# Patient Record
Sex: Male | Born: 2000
Health system: Southern US, Community
[De-identification: ages and names within clinical notes are randomized; demographics above are authoritative.]

## PROBLEM LIST (undated history)

## (undated) DIAGNOSIS — K219 Gastro-esophageal reflux disease without esophagitis: Secondary | ICD-10-CM

## (undated) HISTORY — DX: Gastro-esophageal reflux disease without esophagitis: K21.9

---
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
## 2020-10-14 ENCOUNTER — Telehealth (INDEPENDENT_AMBULATORY_CARE_PROVIDER_SITE_OTHER): Payer: BC Managed Care – PPO | Admitting: Family Medicine

## 2020-10-14 ENCOUNTER — Encounter: Payer: Self-pay | Admitting: Family Medicine

## 2020-10-14 ENCOUNTER — Other Ambulatory Visit: Payer: Self-pay

## 2020-10-14 DIAGNOSIS — J4520 Mild intermittent asthma, uncomplicated: Secondary | ICD-10-CM | POA: Diagnosis not present

## 2020-10-14 NOTE — Progress Notes (Signed)
Jacob Herberg T. Bocephus Cali, MD Primary Care and Sports Medicine [facility] at [facility] [address] [place], [address] Phone: [phone]  FAX: [phone]  Jacob Hull - 19 y.o. male  MRN [id number]  Date of Birth: 2001/04/28  Visit Date: 10/14/2020  PCP: Karie Schwalbe, MD  Referred by: Karie Schwalbe, MD Chief Complaint  Patient presents with  . Cough    Patient states he has had a cough x 6 years   Virtual Visit via Video Note:  I connected with  Jacob Hull on 10/14/2020  9:40 AM EST by a video enabled telemedicine application and verified that I am speaking with the correct person using two identifiers.   Location patient: home computer, tablet, or smartphone Location provider: work or home office Consent: Verbal consent directly obtained from [facility]. Persons participating in the virtual visit: patient, provider  I discussed the limitations of evaluation and management by telemedicine and the availability of in person appointments. The patient expressed understanding and agreed to proceed.  Interactive audio and video telecommunications were attempted between this provider and patient, however failed, due to patient having technical difficulties OR patient did not have access to video capability.  We continued and completed visit with audio only.     History of Present Illness:  Coughing off and on right now.  6 years.    He has had some coughing for about 6 years, and he does carry a diagnosis of asthma.  On our conversation, he thinks that he is not any different and all compared to his normal baseline.  He denies any fever, chills, sweats, headache, nasal congestion, earache, sore throat, nauseousness, vomiting, diarrhea, productive cough.  Globally he feels completely normal.  He does tell me that basically the only reason that he called and set up an appointment was that his parents wanted him to do so.  Review of  Systems as above: See pertinent positives and pertinent negatives per HPI No acute distress verbally   Observations/Objective/Exam:  An attempt was made to discern vital signs over the phone and per patient if applicable and possible.   General:    Alert, Oriented, appears well and in no acute distress  Pulmonary:     On inspection no signs of respiratory distress.  Psych / Neurological:     Pleasant and cooperative.  Assessment and Plan:    ICD-10-CM   1. Mild intermittent asthma in adult without complication  J45.20    Total encounter time: 10 minutes. This includes total time spent on the day of encounter.  He you are reiterated a number of times that he thinks that he is at baseline and he has no other symptoms.  He does use an albuterol inhaler as needed, but he has not done this in some time as well.  At this point I reassured him, I do not think that he needs to change anything.  He does note intermittently uses albuterol if he does have some wheezing or coughing gets a little bit worse.  I discussed the assessment and treatment plan with the patient. The patient was provided an opportunity to ask questions and all were answered. The patient agreed with the plan and demonstrated an understanding of the instructions.   The patient was advised to call back or seek an in-person evaluation if the symptoms worsen or if the condition fails to improve as anticipated.  Follow-up: prn unless noted otherwise below No follow-ups on file.  No orders of the defined types were placed in this encounter.  No orders of the defined types were placed in this encounter.   Signed,  Elpidio Galea. Gracey Tolle, MD

## 2021-04-27 ENCOUNTER — Telehealth: Payer: Self-pay | Admitting: *Deleted

## 2021-04-27 NOTE — Telephone Encounter (Signed)
Left message asking pt's mom to have pt call and either authorize we speak to her or we can speak to him directly since there is not DPR on file.

## 2021-04-27 NOTE — Telephone Encounter (Signed)
There is not much I can do from here--especially if he was seen in person. Pain medications like aleve/advil or tylenol can help. I assume they saw no secondary infections----but I usually use silvadene cream on the blisters once they break. (Okay to send tube 50gm x 0) if they want If he worsens, he should be seen again in person
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
## 2021-10-05 DIAGNOSIS — J069 Acute upper respiratory infection, unspecified: Secondary | ICD-10-CM | POA: Diagnosis not present

## 2021-10-05 DIAGNOSIS — Z20822 Contact with and (suspected) exposure to covid-19: Secondary | ICD-10-CM | POA: Diagnosis not present

## 2022-02-14 DIAGNOSIS — R059 Cough, unspecified: Secondary | ICD-10-CM | POA: Diagnosis not present

## 2022-02-14 DIAGNOSIS — J069 Acute upper respiratory infection, unspecified: Secondary | ICD-10-CM | POA: Diagnosis not present

## 2022-02-14 DIAGNOSIS — R110 Nausea: Secondary | ICD-10-CM | POA: Diagnosis not present

## 2022-02-14 DIAGNOSIS — Z20822 Contact with and (suspected) exposure to covid-19: Secondary | ICD-10-CM | POA: Diagnosis not present

## 2023-05-17 DIAGNOSIS — J029 Acute pharyngitis, unspecified: Secondary | ICD-10-CM | POA: Diagnosis not present

## 2023-05-17 DIAGNOSIS — R112 Nausea with vomiting, unspecified: Secondary | ICD-10-CM | POA: Diagnosis not present

## 2023-05-17 DIAGNOSIS — Z20822 Contact with and (suspected) exposure to covid-19: Secondary | ICD-10-CM | POA: Diagnosis not present

## 2023-05-17 DIAGNOSIS — R059 Cough, unspecified: Secondary | ICD-10-CM | POA: Diagnosis not present

## 2023-05-17 DIAGNOSIS — U071 COVID-19: Secondary | ICD-10-CM | POA: Diagnosis not present

## 2023-09-05 ENCOUNTER — Ambulatory Visit (INDEPENDENT_AMBULATORY_CARE_PROVIDER_SITE_OTHER): Payer: BC Managed Care – PPO | Admitting: Internal Medicine

## 2023-09-05 ENCOUNTER — Encounter: Payer: Self-pay | Admitting: Internal Medicine

## 2023-09-05 VITALS — BP 110/66 | HR 79 | Temp 98.0°F | Ht 71.0 in | Wt 267.0 lb

## 2023-09-05 DIAGNOSIS — F900 Attention-deficit hyperactivity disorder, predominantly inattentive type: Secondary | ICD-10-CM | POA: Diagnosis not present

## 2023-09-05 DIAGNOSIS — Z0000 Encounter for general adult medical examination without abnormal findings: Secondary | ICD-10-CM | POA: Diagnosis not present

## 2023-09-05 DIAGNOSIS — Z23 Encounter for immunization: Secondary | ICD-10-CM

## 2023-09-05 MED ORDER — AMPHETAMINE-DEXTROAMPHETAMINE 10 MG PO TABS: 10.0000 mg | ORAL_TABLET | Freq: Two times a day (BID) | ORAL | 0 refills | Status: DC

## 2023-09-05 NOTE — Assessment & Plan Note (Signed)
Will try adderall 10mg  bid and titrate as needed Can change to long acting prn as well

## 2023-09-05 NOTE — Progress Notes (Signed)
Subjective:    Patient ID: Jacob Hull, male    DOB: 2001/08/15, 22 y.o.   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 18 g 0   No current facility-administered medications on file prior to visit.    No Known Allergies  Past Medical History:  Diagnosis Date   GERD (gastroesophageal reflux disease)    since early teens    Past Surgical History:  Procedure Laterality Date   ADENOIDECTOMY  2004   with bilateral tympanostomies   ADENOIDECTOMY AND MYRINGOTOMY WITH TUBE PLACEMENT  2003    Family History  Problem Relation Age of Onset   Colon cancer Mother    Depression Mother    Hypertension Mother    Hypertension Father    Arthritis Maternal Grandmother    Breast cancer Maternal Grandmother    Uterine cancer Maternal Grandmother    Hypertension Maternal Grandmother    Heart disease Paternal Grandmother    Lung cancer Paternal Grandfather     Social History   Socioeconomic History   Marital status: Single    Spouse name: Not on file   Number of children: Not on file   Years of education: Not on file   Highest education level: Not on file  Occupational History   Occupation: Banker: [employer]  Tobacco Use   Smoking status: Never    Passive exposure: Past   Smokeless tobacco: Never  Substance and Sexual Activity   Alcohol use: Never    Drug use: Never   Sexual activity: Never  Other Topics Concern   Not on file  Social History Narrative   Restaurant manager, fast food   Social Determinants of Health   Financial Resource Strain: Not on file  Food Insecurity: Not on file  Transportation Needs: Not on file  Physical Activity: Not on file  Stress: Not on file  Social Connections: Not on file  Intimate Partner Violence: Not on file   Review of Systems  Constitutional:  Negative for fatigue and unexpected weight change.       Walks for exercise---works nights. Gym in the past Wears seat belt  HENT:  Negative for hearing loss and trouble swallowing.        Rare tinnitus Keeps up with dentist  Eyes:  Negative for visual disturbance.       No diplopia or unilateral vision loss  Respiratory:  Positive for cough. Negative for chest tightness and shortness of breath.        Chronic cough---told GERD   Cardiovascular:  Negative for chest pain, palpitations and leg swelling.  Gastrointestinal:  Negative for blood in stool and constipation.       Heartburn better with improved eating  Genitourinary:  Negative for difficulty urinating, dysuria and hematuria.       No sexual problems--uses condoms  Musculoskeletal:  Negative for arthralgias, back pain and joint swelling.  Skin:  Negative  for rash.  Allergic/Immunologic: Negative for environmental allergies and immunocompromised state.  Neurological:  Negative for dizziness, syncope, light-headedness and headaches.  Hematological:  Negative for adenopathy. Does not bruise/bleed easily.  Psychiatric/Behavioral:  Negative for dysphoric mood and sleep disturbance. The patient is not nervous/anxious.        Objective:   Physical Exam Constitutional:      Appearance: Normal appearance.  HENT:     Mouth/Throat:     Pharynx: No oropharyngeal exudate or posterior oropharyngeal erythema.  Eyes:     Conjunctiva/sclera: Conjunctivae normal.     Pupils: Pupils are equal, round, and  reactive to light.  Cardiovascular:     Rate and Rhythm: Normal rate and regular rhythm.     Pulses: Normal pulses.     Heart sounds: No murmur heard.    No gallop.  Pulmonary:     Effort: Pulmonary effort is normal.     Breath sounds: Normal breath sounds. No wheezing or rales.  Abdominal:     Palpations: Abdomen is soft.     Tenderness: There is no abdominal tenderness.  Genitourinary:    Testes: Normal.  Musculoskeletal:     Cervical back: Neck supple.     Right lower leg: No edema.     Left lower leg: No edema.  Lymphadenopathy:     Cervical: No cervical adenopathy.  Skin:    Findings: No lesion or rash.  Neurological:     General: No focal deficit present.     Mental Status: He is alert and oriented to person, place, and time.  Psychiatric:        Mood and Affect: Mood normal.        Behavior: Behavior normal.            Assessment & Plan:

## 2023-09-05 NOTE — Assessment & Plan Note (Signed)
Healthy Has been exercising Tries to be careful with eating Td today Prefers no flu or COVID vaccine Will need colonoscopy due to mom's history (he thinks she was over 50 at diagnosis)

## 2023-11-06 ENCOUNTER — Encounter: Payer: Self-pay | Admitting: Internal Medicine

## 2023-11-06 ENCOUNTER — Ambulatory Visit: Payer: BC Managed Care – PPO | Admitting: Internal Medicine

## 2023-11-06 VITALS — BP 110/70 | HR 80 | Temp 98.3°F | Ht 71.0 in | Wt 276.0 lb

## 2023-11-06 DIAGNOSIS — F900 Attention-deficit hyperactivity disorder, predominantly inattentive type: Secondary | ICD-10-CM | POA: Diagnosis not present

## 2023-11-06 MED ORDER — AMPHETAMINE-DEXTROAMPHET ER 30 MG PO CP24: 30.0000 mg | ORAL_CAPSULE | Freq: Every day | ORAL | 0 refills | Status: DC

## 2023-11-06 NOTE — Progress Notes (Signed)
   Subjective:    Patient ID: Jacob Hull, male    DOB: 2001/07/17, 23 y.o.   MRN: [id number]  HPI Here for follow up of ADHD  The medication helps Only takes it on work days Forgets the second dose often--and then notices the difference (trouble writing his notes especially) Even supervisor notes difference in quality of notes and work when he is on it  Current Outpatient Medications on File Prior to Visit  Medication Sig Dispense Refill   albuterol (VENTOLIN HFA) 108 (90 Base) MCG/ACT inhaler INHALE 2 PUFFS INTO THE LUNGS EVERY 4 (FOUR) HOURS AS NEEDED FOR WHEEZING OR SHORTNESS OF BREATH. 18 g 0   amphetamine-dextroamphetamine (ADDERALL) 10 MG tablet Take 1 tablet (10 mg total) by mouth 2 (two) times daily. 60 tablet 0   No current facility-administered medications on file prior to visit.    No Known Allergies  Past Medical History:  Diagnosis Date   GERD (gastroesophageal reflux disease)    since early teens    Past Surgical History:  Procedure Laterality Date   ADENOIDECTOMY  2004   with bilateral tympanostomies   ADENOIDECTOMY AND MYRINGOTOMY WITH TUBE PLACEMENT  2003    Family History  Problem Relation Age of Onset   Colon cancer Mother    Depression Mother    Hypertension Mother    Hypertension Father    Arthritis Maternal Grandmother    Breast cancer Maternal Grandmother    Uterine cancer Maternal Grandmother    Hypertension Maternal Grandmother    Heart disease Paternal Grandmother    Lung cancer Paternal Grandfather     Social History   Socioeconomic History   Marital status: Single    Spouse name: Not on file   Number of children: Not on file   Years of education: Not on file   Highest education level: Not on file  Occupational History   Occupation: Banker: [employer]  Tobacco Use   Smoking status: Never    Passive exposure: Past   Smokeless tobacco: Never  Substance and Sexual Activity   Alcohol use: Never    Drug use: Never   Sexual activity: Never  Other Topics Concern   Not on file  Social History Narrative   Restaurant manager, fast food   Social Drivers of Health   Financial Resource Strain: Not on file  Food Insecurity: Not on file  Transportation Needs: Not on file  Physical Activity: Not on file  Stress: Not on file  Social Connections: Not on file  Intimate Partner Violence: Not on file   Review of Systems No abdominal symptoms Appetite is fine--gained weight Sleeps okay--hard on off schedule (work hours 4:30PM---4:30AM)    Objective:   Physical Exam Constitutional:      Appearance: Normal appearance.  Neurological:     Mental Status: He is alert.  Psychiatric:        Mood and Affect: Mood normal.        Behavior: Behavior normal.            Assessment & Plan:

## 2023-11-06 NOTE — Assessment & Plan Note (Signed)
Has done well with the adderall--but can't remember the second dose and even when taking it, it wears out too soon Will change to 30mg  extended release Uses just for work

## 2023-12-18 ENCOUNTER — Other Ambulatory Visit: Payer: Self-pay | Admitting: Internal Medicine

## 2023-12-19 MED ORDER — AMPHETAMINE-DEXTROAMPHET ER 30 MG PO CP24: 30.0000 mg | ORAL_CAPSULE | Freq: Every day | ORAL | 0 refills | Status: DC

## 2023-12-19 NOTE — Telephone Encounter (Signed)
 Last filled 11-06-23 #30 Last OV 11-06-23 Next OV 05-07-24 [facility]

## 2024-02-09 ENCOUNTER — Other Ambulatory Visit: Payer: Self-pay | Admitting: Internal Medicine

## 2024-02-09 NOTE — Telephone Encounter (Signed)
 LAst written 12-19-23 #30 Last OV 11-06-23 Next OV 05-07-24 [facility]

## 2024-02-10 MED ORDER — AMPHETAMINE-DEXTROAMPHET ER 30 MG PO CP24: 30.0000 mg | ORAL_CAPSULE | Freq: Every day | ORAL | 0 refills | Status: DC

## 2024-04-22 ENCOUNTER — Encounter: Payer: Self-pay | Admitting: Internal Medicine

## 2024-04-24 MED ORDER — AMPHETAMINE-DEXTROAMPHET ER 30 MG PO CP24: 30.0000 mg | ORAL_CAPSULE | Freq: Every day | ORAL | 0 refills | Status: DC

## 2024-04-24 NOTE — Telephone Encounter (Signed)
 LOV - 11/06/23 NOV - 05/07/24 RF - 02/10/24 #30/0

## 2024-05-07 ENCOUNTER — Ambulatory Visit: Payer: BC Managed Care – PPO | Admitting: Internal Medicine

## 2024-05-07 ENCOUNTER — Encounter: Payer: Self-pay | Admitting: Internal Medicine

## 2024-05-07 VITALS — BP 104/70 | HR 76 | Temp 98.7°F | Ht 71.0 in | Wt 265.0 lb

## 2024-05-07 DIAGNOSIS — F900 Attention-deficit hyperactivity disorder, predominantly inattentive type: Secondary | ICD-10-CM | POA: Diagnosis not present

## 2024-05-07 NOTE — Assessment & Plan Note (Signed)
 Doing well with the extended release adderall XR 30mg  daily for work Much better since he doesn't need second dose No sig side effects

## 2024-05-07 NOTE — Progress Notes (Signed)
   Subjective:    Patient ID: Jacob Hull, male    DOB: Nov 17, 2000, 23 y.o.   MRN: [id number]  HPI Here for follow up of ADHD  Doing well on the extended release med No trouble sleeping No GI side effects and appetite is okay  Still uses it just for work Maintains its effect through his work day  Current Outpatient Medications on File Prior to Visit  Medication Sig Dispense Refill   albuterol  (VENTOLIN  HFA) 108 (90 Base) MCG/ACT inhaler INHALE 2 PUFFS INTO THE LUNGS EVERY 4 (FOUR) HOURS AS NEEDED FOR WHEEZING OR SHORTNESS OF BREATH. 18 g 0   amphetamine -dextroamphetamine  (ADDERALL XR) 30 MG 24 hr capsule Take 1 capsule (30 mg total) by mouth daily. 30 capsule 0   No current facility-administered medications on file prior to visit.    No Known Allergies  Past Medical History:  Diagnosis Date   GERD (gastroesophageal reflux disease)    since early teens    Past Surgical History:  Procedure Laterality Date   ADENOIDECTOMY  2004   with bilateral tympanostomies   ADENOIDECTOMY AND MYRINGOTOMY WITH TUBE PLACEMENT  2003    Family History  Problem Relation Age of Onset   Colon cancer Mother    Depression Mother    Hypertension Mother    Hypertension Father    Arthritis Maternal Grandmother    Breast cancer Maternal Grandmother    Uterine cancer Maternal Grandmother    Hypertension Maternal Grandmother    Heart disease Paternal Grandmother    Lung cancer Paternal Grandfather     Social History   Socioeconomic History   Marital status: Single    Spouse name: Not on file   Number of children: Not on file   Years of education: Not on file   Highest education level: Not on file  Occupational History   Occupation: Banker: [employer]  Tobacco Use   Smoking status: Never    Passive exposure: Past   Smokeless tobacco: Never  Substance and Sexual Activity   Alcohol use: Never   Drug use: Never   Sexual activity: Never  Other  Topics Concern   Not on file  Social History Narrative   Restaurant manager, fast food   Social Drivers of Health   Financial Resource Strain: Not on file  Food Insecurity: Not on file  Transportation Needs: Not on file  Physical Activity: Not on file  Stress: Not on file  Social Connections: Not on file  Intimate Partner Violence: Not on file   Review of Systems       Physical Exam Constitutional:      Appearance: Normal appearance.  Neurological:     Mental Status: He is alert.  Psychiatric:        Mood and Affect: Mood normal.        Behavior: Behavior normal.            Assessment & Plan:

## 2024-05-30 ENCOUNTER — Encounter: Payer: Self-pay | Admitting: Internal Medicine

## 2024-05-30 MED ORDER — AMPHETAMINE-DEXTROAMPHET ER 30 MG PO CP24: 30.0000 mg | ORAL_CAPSULE | Freq: Every day | ORAL | 0 refills | Status: DC

## 2024-05-30 NOTE — Telephone Encounter (Signed)
 Last written 04-24-24 #30 Last OV 05-07-24 Next OV 11-11-24 [facility]

## 2024-09-02 MED ORDER — AMPHETAMINE-DEXTROAMPHET ER 30 MG PO CP24: 30.0000 mg | ORAL_CAPSULE | Freq: Every day | ORAL | 0 refills | Status: AC

## 2024-11-11 ENCOUNTER — Encounter
